# Patient Record
Sex: Male | Born: 2000 | Race: Black or African American | Hispanic: No | Marital: Single | Smoking: Never smoker
Health system: Southern US, Community
[De-identification: ages and names within clinical notes are randomized; demographics above are authoritative.]

## PROBLEM LIST (undated history)

## (undated) DIAGNOSIS — E669 Obesity, unspecified: Secondary | ICD-10-CM

## (undated) DIAGNOSIS — F79 Unspecified intellectual disabilities: Secondary | ICD-10-CM

## (undated) DIAGNOSIS — H5210 Myopia, unspecified eye: Secondary | ICD-10-CM

## (undated) DIAGNOSIS — Z87898 Personal history of other specified conditions: Secondary | ICD-10-CM

## (undated) HISTORY — DX: Unspecified intellectual disabilities: F79

## (undated) HISTORY — PX: NO PAST SURGERIES: SHX2092

## (undated) HISTORY — DX: Myopia, unspecified eye: H52.10

## (undated) HISTORY — DX: Obesity, unspecified: E66.9

---
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
## 2020-06-01 DIAGNOSIS — M7989 Other specified soft tissue disorders: Secondary | ICD-10-CM | POA: Insufficient documentation

## 2020-06-15 ENCOUNTER — Other Ambulatory Visit: Payer: Self-pay

## 2020-06-15 ENCOUNTER — Ambulatory Visit (INDEPENDENT_AMBULATORY_CARE_PROVIDER_SITE_OTHER): Payer: Medicaid Other | Admitting: Nurse Practitioner

## 2020-06-15 ENCOUNTER — Encounter (INDEPENDENT_AMBULATORY_CARE_PROVIDER_SITE_OTHER): Payer: Self-pay | Admitting: Nurse Practitioner

## 2020-06-15 VITALS — BP 103/69 | HR 81 | Resp 16 | Ht 72.0 in | Wt 312.0 lb

## 2020-06-15 DIAGNOSIS — R600 Localized edema: Secondary | ICD-10-CM | POA: Diagnosis not present

## 2020-06-15 DIAGNOSIS — Z6841 Body Mass Index (BMI) 40.0 and over, adult: Secondary | ICD-10-CM | POA: Diagnosis not present

## 2020-06-15 DIAGNOSIS — M7989 Other specified soft tissue disorders: Secondary | ICD-10-CM

## 2020-06-16 ENCOUNTER — Other Ambulatory Visit (INDEPENDENT_AMBULATORY_CARE_PROVIDER_SITE_OTHER): Payer: Self-pay | Admitting: Nurse Practitioner

## 2020-06-16 DIAGNOSIS — R600 Localized edema: Secondary | ICD-10-CM

## 2020-06-17 ENCOUNTER — Encounter (INDEPENDENT_AMBULATORY_CARE_PROVIDER_SITE_OTHER): Payer: Self-pay | Admitting: Nurse Practitioner

## 2020-06-17 NOTE — Progress Notes (Signed)
Subjective:    Patient ID: Richard Sanders, male    DOB: 2000/10/28, 20 y.o.   MRN: [id number] Chief Complaint  Patient presents with  . New Patient (Initial Visit)    Ref rogers edema    Richard Sanders is a 20 year old male that presents today for evaluation of lower extremity edema.  Prior to the patient's office visit he had been placed on diuretics to help with his lower extremity edema.  In addition to being placed on diuretics the patient enjoyed a diet that mainly consisted of fast food including hamburgers and [place] fries and hot dogs.  The patient also drink a lot of of soft drinks including [employer] and Pepsi.  He recently saw nephrology who diagnosed him with a minor acute kidney injury.  I suspect this may have also been a contributing cause of his lower extremity edema.  The patient has recently changed some of his medications as well as began eating a much healthier diet of not fast food and vegetables.  He has also began recently walking on a regular basis for at least 30 minutes a day 5 days a week with his caretaker.  He also elevates his legs as much as possible.  He was wearing compression socks however he notes that his primary care physician told him to stop wearing compression socks, although they are unsure why.  His caretaker notes that the swelling is greatly decreased from when this began but he still has some swelling remaining.  He denies any open wounds or ulcerations.  He denies any fever or chills.   Review of Systems  Cardiovascular: Positive for leg swelling.  All other systems reviewed and are negative.      Objective:   Physical Exam Vitals reviewed.  HENT:     Head: Normocephalic.  Cardiovascular:     Rate and Rhythm: Normal rate and regular rhythm.     Pulses: Normal pulses.  Pulmonary:     Effort: Pulmonary effort is normal.     Breath sounds: Normal breath sounds.  Musculoskeletal:     Right lower leg: 1+ Edema present.     Left lower leg: 1+  Edema present.  Neurological:     Mental Status: He is alert and oriented to person, place, and time.  Psychiatric:        Mood and Affect: Mood normal.        Behavior: Behavior normal.        Thought Content: Thought content normal.        Judgment: Judgment normal.     BP 103/69 (BP Location: Right Arm)   Pulse 81   Resp 16   Ht 6' (1.829 m)   Wt (!) 312 lb (141.5 kg)   BMI 42.31 kg/m   Past Medical History:  Diagnosis Date  . History of peripheral edema    since at least 2017  . Intellectual disability   . Myopia   . Obesity     Social History   Socioeconomic History  . Marital status: Single    Spouse name: Not on file  . Number of children: Not on file  . Years of education: Not on file  . Highest education level: Not on file  Occupational History  . Not on file  Tobacco Use  . Smoking status: Never Smoker  . Smokeless tobacco: Never Used  Substance and Sexual Activity  . Alcohol use: Never  . Drug use: Never  . Sexual activity: Not  on file  Other Topics Concern  . Not on file  Social History Narrative  . Not on file   Social Determinants of Health   Financial Resource Strain: Not on file  Food Insecurity: Not on file  Transportation Needs: Not on file  Physical Activity: Not on file  Stress: Not on file  Social Connections: Not on file  Intimate Partner Violence: Not on file    Past Surgical History:  Procedure Laterality Date  . NO PAST SURGERIES      Family History  Problem Relation Age of Onset  . Diabetes Maternal Aunt   . Diabetes Maternal Uncle   . Diabetes Maternal Grandmother     No Known Allergies  CBC Latest Ref Rng & Units 09/07/2019  WBC 4.0 - 10.5 K/uL 11.4(H)  Hemoglobin 13.0 - 17.0 g/dL 03.5  Hematocrit 00.9 - 52.0 % 42.0  Platelets 150 - 400 K/uL 184      CMP     Component Value Date/Time   NA 139 09/07/2019 2205   K 4.2 09/07/2019 2205   CL 104 09/07/2019 2205   CO2 27 09/07/2019 2205   GLUCOSE 96  09/07/2019 2205   BUN 14 09/07/2019 2205   CREATININE 1.43 (H) 09/07/2019 2205   CALCIUM 9.2 09/07/2019 2205   GFRNONAA >60 09/07/2019 2205   GFRAA >60 09/07/2019 2205     No results found.     Assessment & Plan:   1. Swelling of lower extremity I have had a long discussion with the patient regarding swelling and why it  causes symptoms.  Patient will begin wearing graduated compression stockings class 1 (20-30 mmHg) on a daily basis a prescription was given. The patient will  beginning wearing the stockings first thing in the morning and removing them in the evening. The patient is instructed specifically not to sleep in the stockings.   In addition, behavioral modification will be initiated.  This will include frequent elevation, use of over the counter pain medications and exercise such as walking.  I have reviewed systemic causes for chronic edema such as liver, kidney and cardiac etiologies.  The patient denies problems with these organ systems.    Consideration for a lymph pump will also be made based upon the effectiveness of conservative therapy.  This would help to improve the edema control and prevent sequela such as ulcers and infections   Patient should undergo duplex ultrasound of the venous system to ensure that DVT or reflux is not present.  The patient will follow-up with me after the ultrasound.   - VAS [place] LOWER EXTREMITY VENOUS REFLUX; Future  2. Class 3 severe obesity due to excess calories without serious comorbidity with body mass index (BMI) of 40.0 to 44.9 in adult [facility]) This is likely a contributing cause to his edema   Current Outpatient Medications on File Prior to Visit  Medication Sig Dispense Refill  . Cholecalciferol 1.25 MG (50000 UT) capsule TAKE 1 CAPSULE BY MOUTH ONE TIME PER WEEK    . furosemide (LASIX) 20 MG tablet Take by mouth.     No current facility-administered medications on file prior to visit.    There are no Patient Instructions on  file for this visit. No follow-ups on file.   Georgiana Spinner, NP

## 2020-06-19 ENCOUNTER — Ambulatory Visit (INDEPENDENT_AMBULATORY_CARE_PROVIDER_SITE_OTHER): Payer: Medicaid Other | Admitting: Nurse Practitioner

## 2020-06-19 ENCOUNTER — Encounter (INDEPENDENT_AMBULATORY_CARE_PROVIDER_SITE_OTHER): Payer: Self-pay | Admitting: Nurse Practitioner

## 2020-06-19 ENCOUNTER — Ambulatory Visit (INDEPENDENT_AMBULATORY_CARE_PROVIDER_SITE_OTHER): Payer: Medicaid Other

## 2020-06-19 ENCOUNTER — Other Ambulatory Visit: Payer: Self-pay

## 2020-06-19 VITALS — BP 110/76 | HR 73 | Resp 16 | Wt 310.0 lb

## 2020-06-19 DIAGNOSIS — Z6841 Body Mass Index (BMI) 40.0 and over, adult: Secondary | ICD-10-CM

## 2020-06-19 DIAGNOSIS — I890 Lymphedema, not elsewhere classified: Secondary | ICD-10-CM | POA: Diagnosis not present

## 2020-06-19 DIAGNOSIS — R600 Localized edema: Secondary | ICD-10-CM

## 2020-06-20 ENCOUNTER — Encounter (INDEPENDENT_AMBULATORY_CARE_PROVIDER_SITE_OTHER): Payer: Self-pay | Admitting: Nurse Practitioner

## 2020-06-20 NOTE — Progress Notes (Signed)
Subjective:    Patient ID: Richard Sanders, male    DOB: 21-Dec-2000, 20 y.o.   MRN: [id number] Chief Complaint  Patient presents with  . Follow-up    Ultrasound follow up     Richard Sanders is a 20 year old male that returns today for noninvasive studies to evaluate his evaluation of lower extremity edema.  Prior to the patient's office visit he had been placed on diuretics to help with his lower extremity edema.  In addition to being placed on diuretics the patient enjoyed a diet that mainly consisted of fast food including hamburgers and [place] fries and hot dogs.  The patient also drink a lot of of soft drinks including [employer] and Pepsi.  He recently saw nephrology who diagnosed him with a minor acute kidney injury.  I suspect this may have also been a contributing cause of his lower extremity edema.  The patient has recently changed some of his medications as well as began eating a much healthier diet of not fast food and vegetables.  He has also began recently walking on a regular basis for at least 30 minutes a day 5 days a week with his caretaker.  He also elevates his legs as much as possible.  He is wearing compression socks.  His caretaker notes that the swelling is greatly decreased from when this began but he still has some swelling remaining.  He denies any open wounds or ulcerations.  He denies any fever or chills.  Today noninvasive studies show no evidence of deep vein thrombosis or superficial thrombophlebitis.  No evidence of superficial venous reflux seen in the bilateral lower extremities.  No evidence of deep venous insufficiency in the left lower extremity.  There is evidence of deep venous insufficiency in the right common femoral vein.    Review of Systems  All other systems reviewed and are negative.      Objective:   Physical Exam Vitals reviewed.  HENT:     Head: Normocephalic.  Cardiovascular:     Rate and Rhythm: Normal rate.     Pulses: Normal pulses.   Pulmonary:     Effort: Pulmonary effort is normal.  Musculoskeletal:     Right lower leg: 2+ Edema present.     Left lower leg: 2+ Edema present.  Skin:    Comments: Dermal thickening bilaterally  Neurological:     Mental Status: He is alert and oriented to person, place, and time.  Psychiatric:        Mood and Affect: Mood normal.        Behavior: Behavior normal.        Thought Content: Thought content normal.        Cognition and Memory: Cognition is impaired.        Judgment: Judgment normal.     BP 110/76 (BP Location: Right Arm)   Pulse 73   Resp 16   Wt (!) 310 lb (140.6 kg)   BMI 42.04 kg/m   Past Medical History:  Diagnosis Date  . History of peripheral edema    since at least 2017  . Intellectual disability   . Myopia   . Obesity     Social History   Socioeconomic History  . Marital status: Single    Spouse name: Not on file  . Number of children: Not on file  . Years of education: Not on file  . Highest education level: Not on file  Occupational History  . Not on file  Tobacco Use  . Smoking status: Never Smoker  . Smokeless tobacco: Never Used  Substance and Sexual Activity  . Alcohol use: Never  . Drug use: Never  . Sexual activity: Not on file  Other Topics Concern  . Not on file  Social History Narrative  . Not on file   Social Determinants of Health   Financial Resource Strain: Not on file  Food Insecurity: Not on file  Transportation Needs: Not on file  Physical Activity: Not on file  Stress: Not on file  Social Connections: Not on file  Intimate Partner Violence: Not on file    Past Surgical History:  Procedure Laterality Date  . NO PAST SURGERIES      Family History  Problem Relation Age of Onset  . Diabetes Maternal Aunt   . Diabetes Maternal Uncle   . Diabetes Maternal Grandmother     No Known Allergies  CBC Latest Ref Rng & Units 09/07/2019  WBC 4.0 - 10.5 K/uL 11.4(H)  Hemoglobin 13.0 - 17.0 g/dL 21.3   Hematocrit 08.6 - 52.0 % 42.0  Platelets 150 - 400 K/uL 184      CMP     Component Value Date/Time   NA 139 09/07/2019 2205   K 4.2 09/07/2019 2205   CL 104 09/07/2019 2205   CO2 27 09/07/2019 2205   GLUCOSE 96 09/07/2019 2205   BUN 14 09/07/2019 2205   CREATININE 1.43 (H) 09/07/2019 2205   CALCIUM 9.2 09/07/2019 2205   GFRNONAA >60 09/07/2019 2205   GFRAA >60 09/07/2019 2205     No results found.     Assessment & Plan:   1. Lymphedema Recommend:  No surgery or intervention at this point in time.    I have reviewed my previous discussion with the patient regarding swelling and why it causes symptoms.  Patient will continue wearing graduated compression stockings class 1 (20-30 mmHg) on a daily basis. The patient will  beginning wearing the stockings first thing in the morning and removing them in the evening. The patient is instructed specifically not to sleep in the stockings.    In addition, behavioral modification including several periods of elevation of the lower extremities during the day will be continued.  This was reviewed with the patient during the initial visit.  The patient will also continue routine exercise, especially walking on a daily basis as was discussed during the initial visit.    Despite conservative treatments of at least 4 weeks including graduated compression therapy class 1 and behavioral modification including exercise and elevation the patient  has not obtained adequate control of the lymphedema.  The patient still has stage 3 lymphedema and therefore, I believe that a lymph pump should be added to improve the control of the patient's lymphedema.  Additionally, a lymph pump is warranted because it will reduce the risk of cellulitis and ulceration in the future.  Patient should follow-up in six months   The patient will also benefit from going to lymphedema clinic to learn other ways to manage his lymphedema given his age and this being a  lifelong condition. - Ambulatory referral to Occupational Therapy  2. Class 3 severe obesity due to excess calories without serious comorbidity with body mass index (BMI) of 40.0 to 44.9 in adult [facility]) The patient has made some significant changes and is exercising regularly.  This will help with not only his lymphedema but his obesity.  Helping his obesity internum also helps his lymphedema.   Current  Outpatient Medications on File Prior to Visit  Medication Sig Dispense Refill  . Cholecalciferol 1.25 MG (50000 UT) capsule TAKE 1 CAPSULE BY MOUTH ONE TIME PER WEEK    . furosemide (LASIX) 20 MG tablet Take by mouth.     No current facility-administered medications on file prior to visit.    There are no Patient Instructions on file for this visit. No follow-ups on file.   Georgiana Spinner, NP

## 2020-12-18 ENCOUNTER — Ambulatory Visit (INDEPENDENT_AMBULATORY_CARE_PROVIDER_SITE_OTHER): Payer: Medicaid Other | Admitting: Vascular Surgery

## 2020-12-19 ENCOUNTER — Encounter (INDEPENDENT_AMBULATORY_CARE_PROVIDER_SITE_OTHER): Payer: Self-pay | Admitting: Vascular Surgery

## 2021-02-10 ENCOUNTER — Emergency Department
Admission: EM | Admit: 2021-02-10 | Discharge: 2021-02-10 | Discharge status: Against Medical Advice | Payer: Medicaid Other | Source: Home / Self Care

## 2021-11-18 IMAGING — CR DG CHEST 2V
2 series · 2 of 2 positions shown · non-contrast
Comparison: None.

CLINICAL DATA: Bilateral leg swelling.  Cough for 3 days.

EXAM:
CHEST - 2 VIEW

[chest pa]
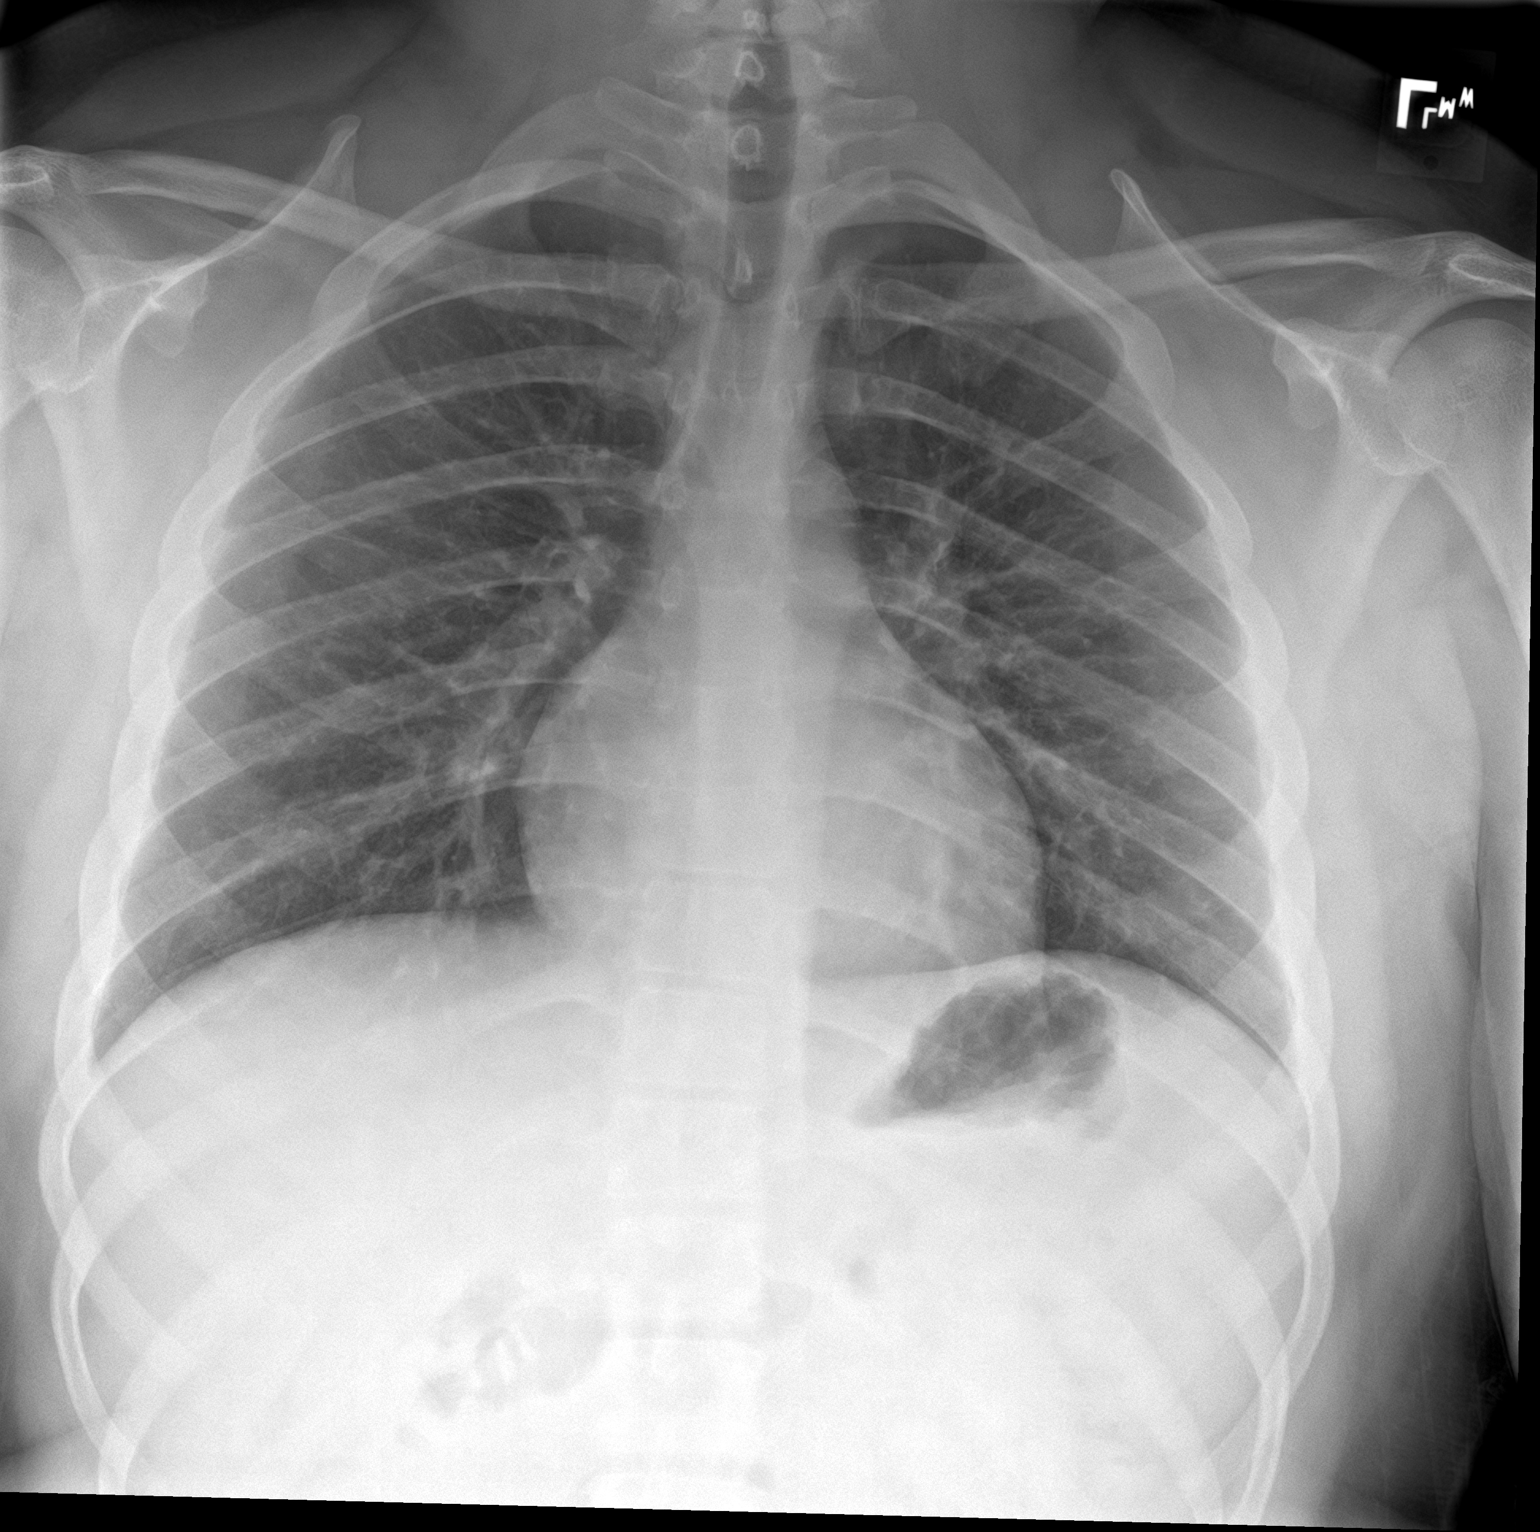

[chest lat]
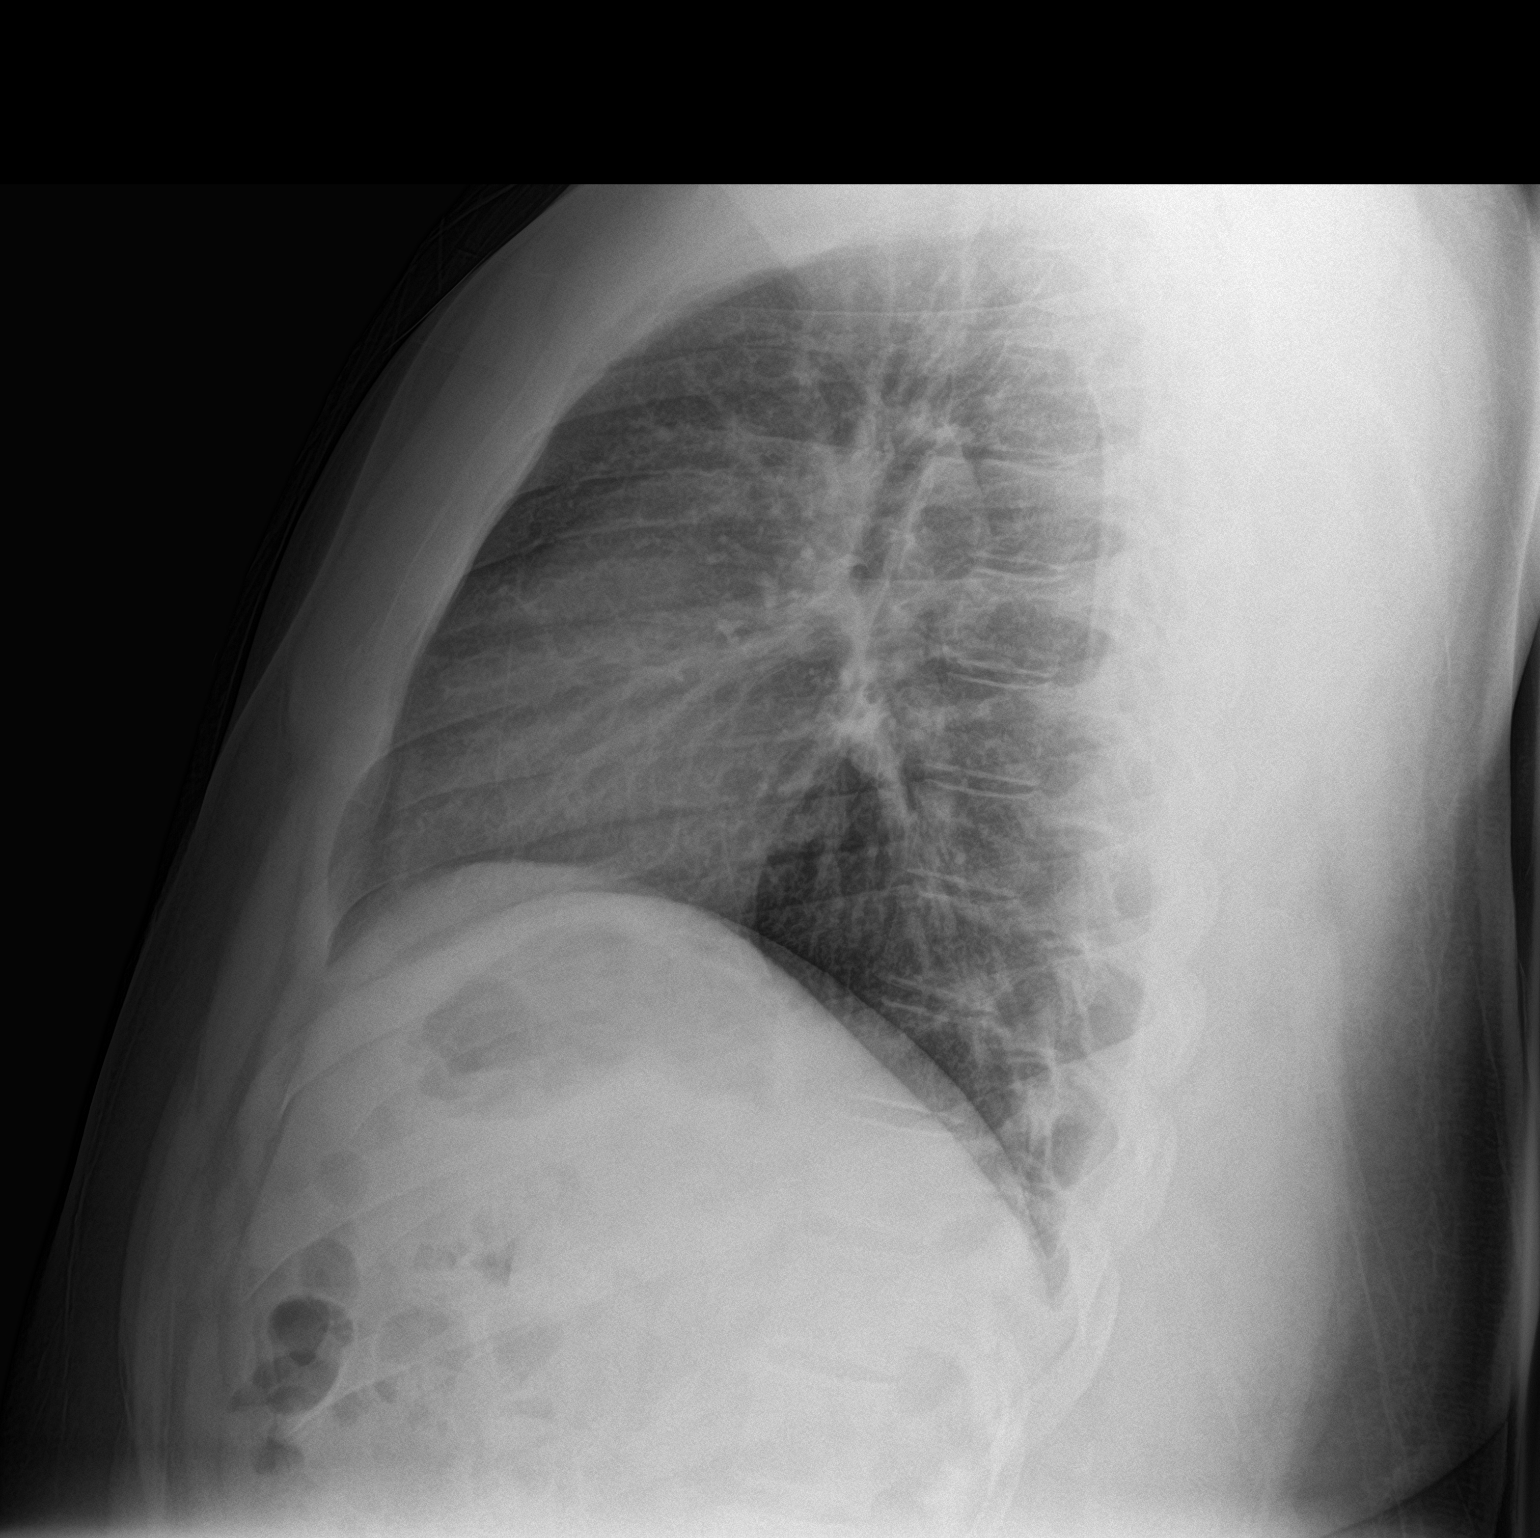

[2 of 2 positions shown; findings below may reference images not displayed]

FINDINGS: The cardiomediastinal contours are normal. The lungs are clear.
Pulmonary vasculature is normal. No consolidation, pleural effusion,
or pneumothorax. No acute osseous abnormalities are seen.
IMPRESSION: Negative radiographs of the chest.
